# Patient Record
Sex: Female | Born: 1980 | Race: White | Hispanic: No | Marital: Married
Health system: Southern US, Community
[De-identification: ages and names within clinical notes are randomized; demographics above are authoritative.]

## PROBLEM LIST (undated history)

## (undated) DIAGNOSIS — J36 Peritonsillar abscess: Secondary | ICD-10-CM

## (undated) HISTORY — DX: Peritonsillar abscess: J36

---

## 2020-08-08 ENCOUNTER — Emergency Department: Payer: Self-pay

## 2020-08-08 ENCOUNTER — Encounter: Payer: Self-pay | Admitting: Emergency Medicine

## 2020-08-08 ENCOUNTER — Emergency Department
Admission: EM | Admit: 2020-08-08 | Discharge: 2020-08-08 | Disposition: A | Discharge status: Home / Self Care | Payer: Self-pay | Attending: Emergency Medicine | Admitting: Emergency Medicine

## 2020-08-08 ENCOUNTER — Other Ambulatory Visit: Payer: Self-pay

## 2020-08-08 DIAGNOSIS — J36 Peritonsillar abscess: Secondary | ICD-10-CM | POA: Insufficient documentation

## 2020-08-08 DIAGNOSIS — Z20822 Contact with and (suspected) exposure to covid-19: Secondary | ICD-10-CM | POA: Insufficient documentation

## 2020-08-08 LAB — CBC WITH DIFFERENTIAL/PLATELET
Abs Immature Granulocytes: 0.08 10*3/uL — ABNORMAL HIGH (ref 0.00–0.07)
Basophils Absolute: 0.1 10*3/uL (ref 0.0–0.1)
Basophils Relative: 0 %
Eosinophils Absolute: 0.3 10*3/uL (ref 0.0–0.5)
Eosinophils Relative: 2 %
HCT: 40.9 % (ref 36.0–46.0)
Hemoglobin: 12.7 g/dL (ref 12.0–15.0)
Immature Granulocytes: 1 %
Lymphocytes Relative: 18 %
Lymphs Abs: 2.9 10*3/uL (ref 0.7–4.0)
MCH: 23.8 pg — ABNORMAL LOW (ref 26.0–34.0)
MCHC: 31.1 g/dL (ref 30.0–36.0)
MCV: 76.6 fL — ABNORMAL LOW (ref 80.0–100.0)
Monocytes Absolute: 1.2 10*3/uL — ABNORMAL HIGH (ref 0.1–1.0)
Monocytes Relative: 8 %
Neutro Abs: 11.9 10*3/uL — ABNORMAL HIGH (ref 1.7–7.7)
Neutrophils Relative %: 71 %
Platelets: 305 10*3/uL (ref 150–400)
RBC: 5.34 MIL/uL — ABNORMAL HIGH (ref 3.87–5.11)
RDW: 20.6 % — ABNORMAL HIGH (ref 11.5–15.5)
WBC: 16.5 10*3/uL — ABNORMAL HIGH (ref 4.0–10.5)
nRBC: 0 % (ref 0.0–0.2)

## 2020-08-08 LAB — COMPREHENSIVE METABOLIC PANEL
ALT: 14 U/L (ref 0–44)
AST: 32 U/L (ref 15–41)
Albumin: 3.7 g/dL (ref 3.5–5.0)
Alkaline Phosphatase: 47 U/L (ref 38–126)
Anion gap: 9 (ref 5–15)
BUN: 10 mg/dL (ref 6–20)
CO2: 23 mmol/L (ref 22–32)
Calcium: 9.3 mg/dL (ref 8.9–10.3)
Chloride: 104 mmol/L (ref 98–111)
Creatinine, Ser: 0.79 mg/dL (ref 0.44–1.00)
GFR, Estimated: >60 mL/min (ref >60)
Glucose, Bld: 115 mg/dL — ABNORMAL HIGH (ref 70–99)
Potassium: 4.9 mmol/L (ref 3.5–5.1)
Sodium: 136 mmol/L (ref 135–145)
Total Bilirubin: 1 mg/dL (ref 0.3–1.2)
Total Protein: 7.6 g/dL (ref 6.5–8.1)

## 2020-08-08 LAB — RESP PANEL BY RT-PCR (FLU A&B, COVID) ARPGX2
Influenza A by PCR: NEGATIVE
Influenza B by PCR: NEGATIVE
SARS Coronavirus 2 by RT PCR: NEGATIVE

## 2020-08-08 LAB — GROUP A STREP BY PCR: Group A Strep by PCR: NOT DETECTED

## 2020-08-08 LAB — HCG, QUANTITATIVE, PREGNANCY: hCG, Beta Chain, Quant, S: 1 m[IU]/mL (ref <5)

## 2020-08-08 IMAGING — CT CT NECK W/ CM
3 of 4 series · 13 of 35 positions shown, 16 images · IV contrast (omnipaque)
Comparison: None.

CLINICAL DATA: Initial evaluation for acute swelling of left
tonsil.

EXAM:
CT NECK WITH CONTRAST
TECHNIQUE: Multidetector CT imaging of the neck was performed using the
standard protocol following the bolus administration of intravenous
contrast.
CONTRAST:  75mL OMNIPAQUE IOHEXOL 300 MG/ML  SOLN

[Series 5: sag neck · sagittal · 0.47mm/px · 5 of 111 slices shown, 6 images]
[im 37/111  bone]
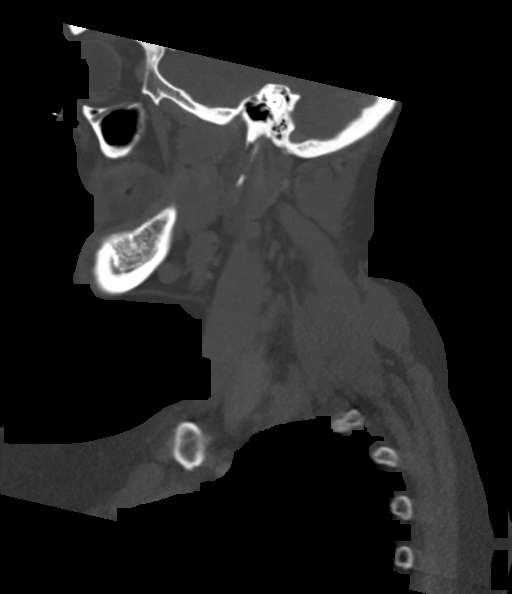
[im 46/111  bone]
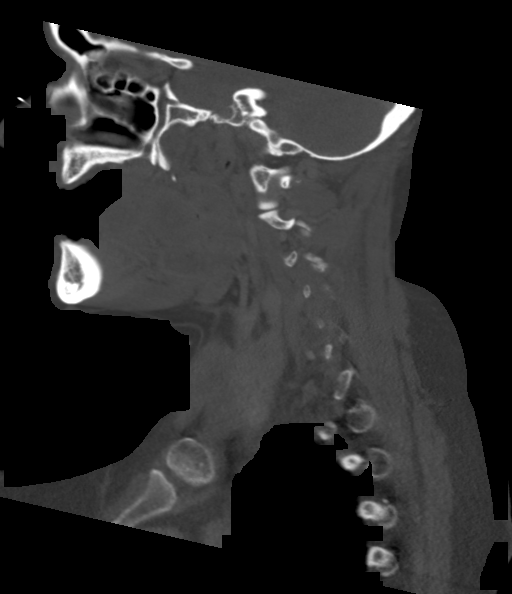
[im 56/111  soft-tissue]
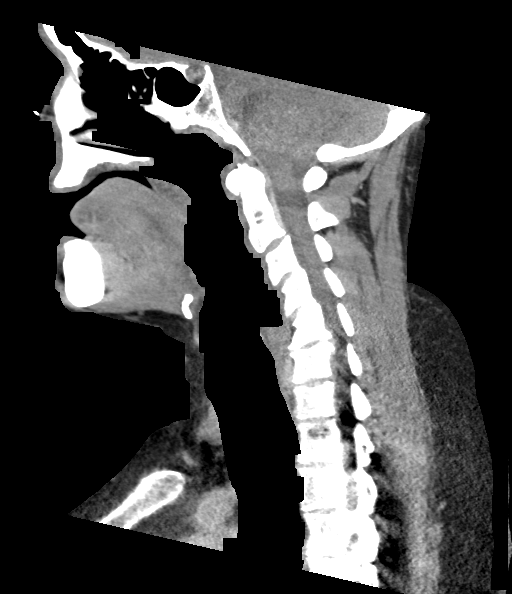
[im 56/111  bone]
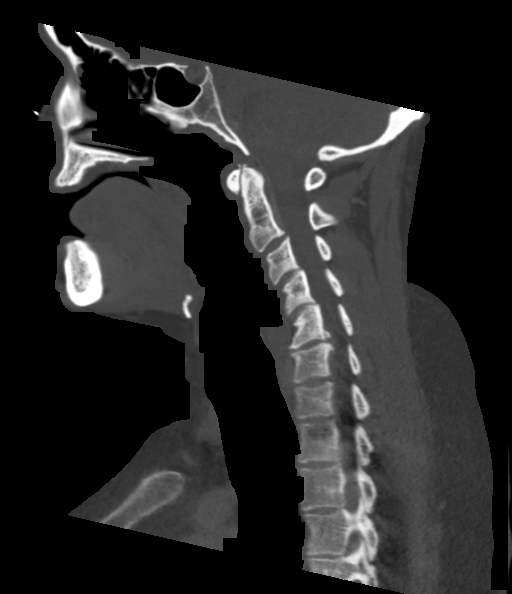
[im 65/111  bone]
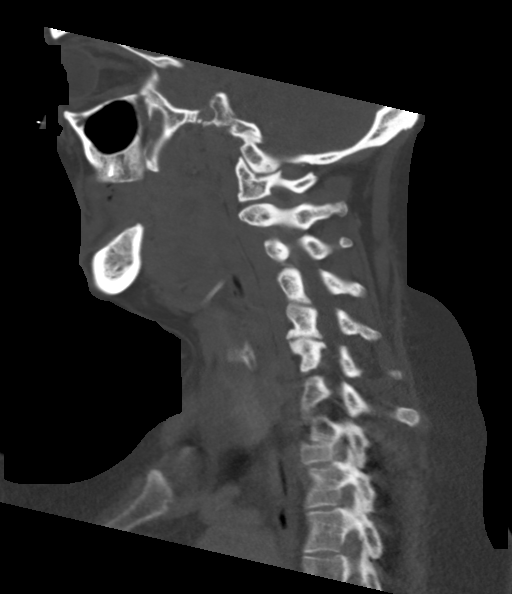
[im 74/111  bone]
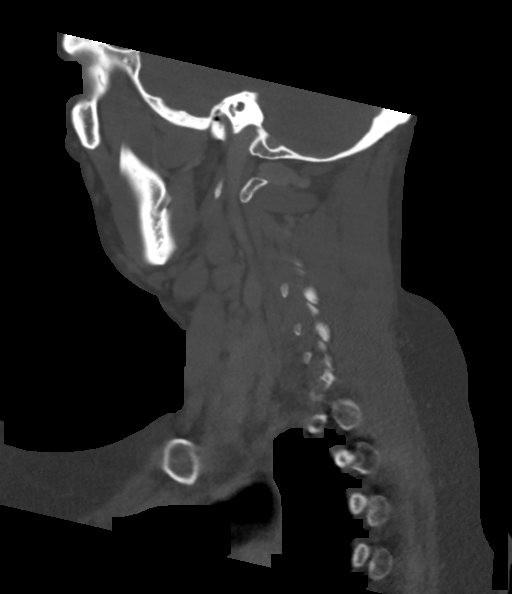

[Series 6: cor neck · coronal · 0.45mm/px · 3 of 117 slices shown]
[im 35/117  bone]
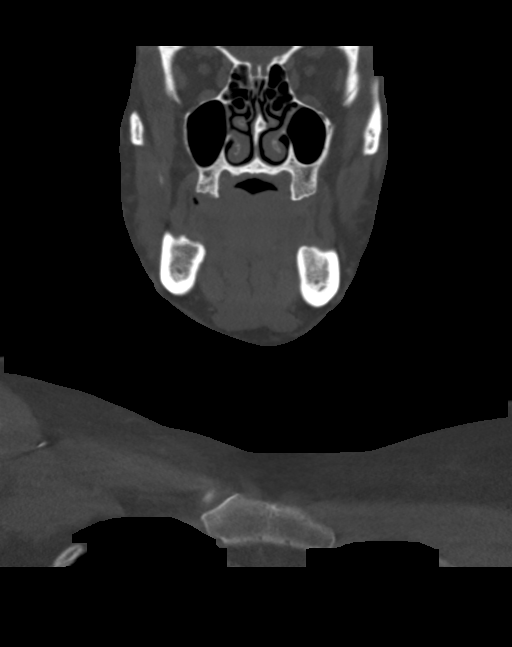
[im 51/117  bone]
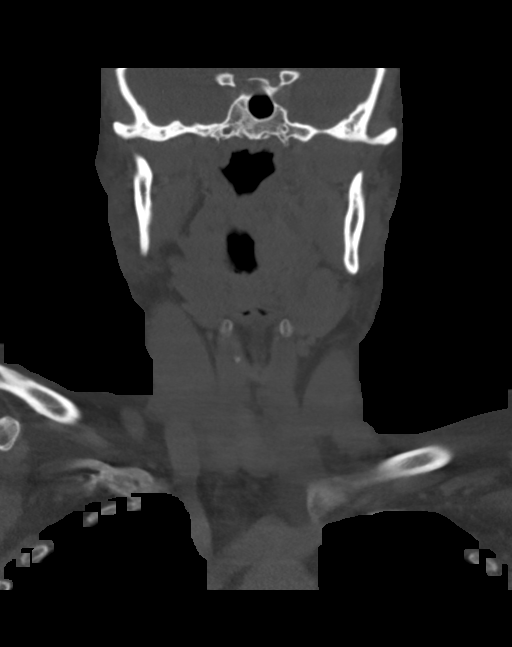
[im 67/117  bone]
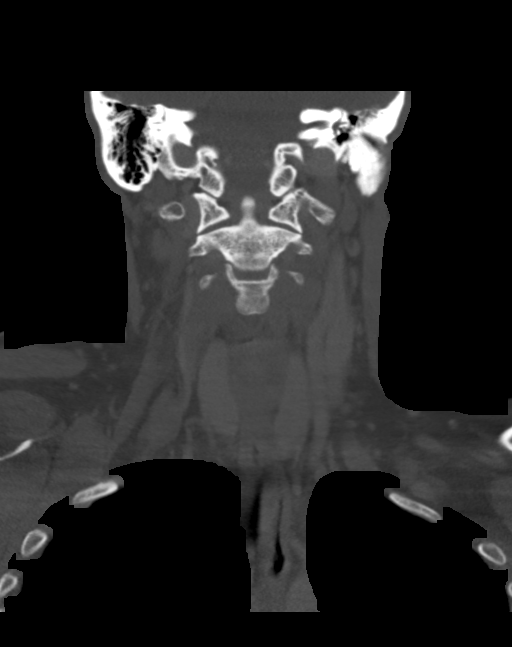

[Series 7: orthogonal ax · axial · 0.43mm/px · z∈[-270,-85]mm · 5 of 140 slices shown, 7 images]
[im 20/140  soft-tissue]
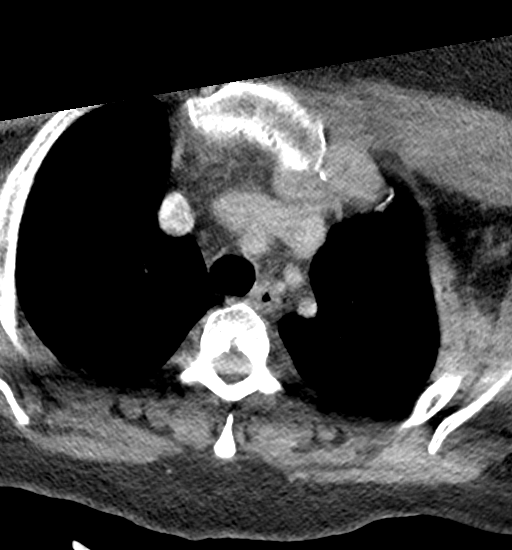
[im 20/140  bone]
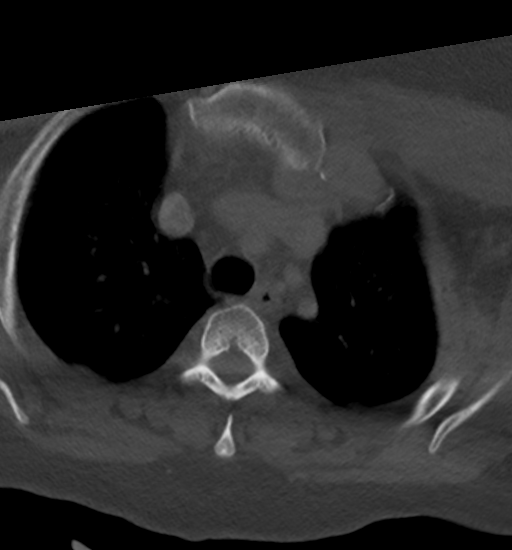
[im 40/140  bone]
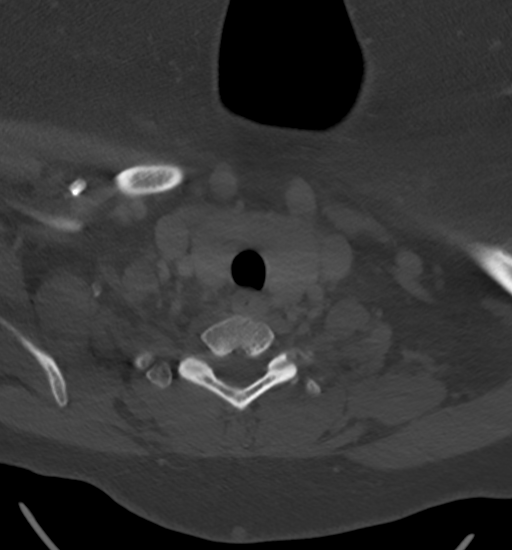
[im 80/140  bone]
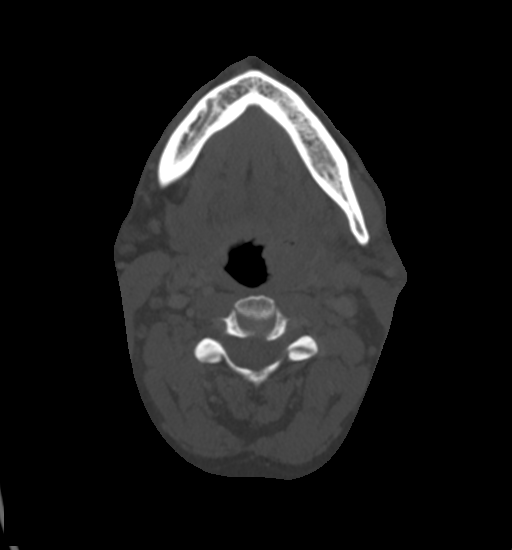
[im 100/140  bone]
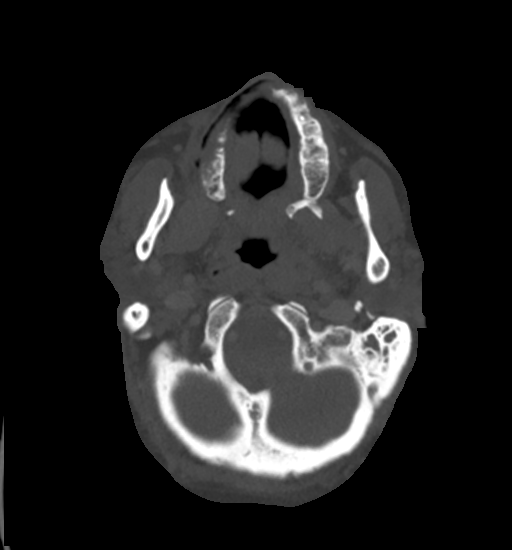
[im 120/140  soft-tissue]
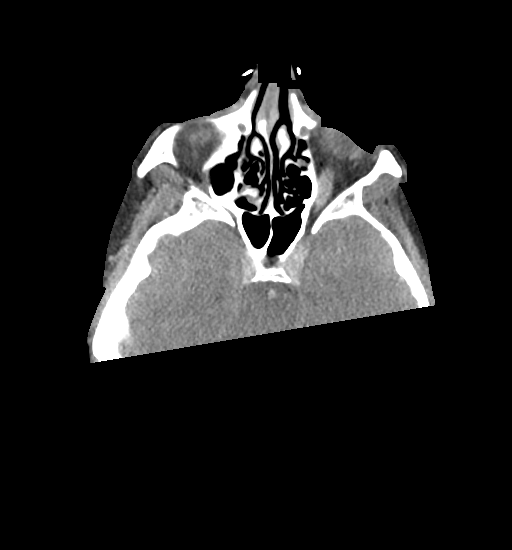
[im 120/140  bone]
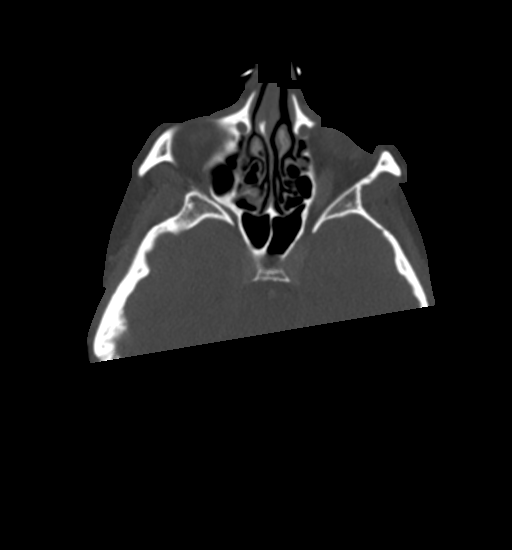

[13 of 35 positions shown; findings below may reference images not displayed]

FINDINGS: Pharynx and larynx: Oral cavity within normal limits. Asymmetric
enlargement and enhancement of the left tonsil, consistent with
acute tonsillitis. Curvilinear hypodense collection along the
lateral and anterior margin of the tonsil measures 8 x 17 x 9 mm,
consistent with tonsillar/peritonsillar abscess (series 6, image
51). Left tonsil is edematous and somewhat medialized towards the
midline. Associated inflammatory stranding within the adjacent left
parapharyngeal space. Right tonsil within normal limits. Nasopharynx
normal. No retropharyngeal collection or swelling. Negative
epiglottis. Vallecula clear. Hypopharynx and supraglottic larynx
within normal limits. Glottis normal. Subglottic airway patent
clear.

Salivary glands: Salivary glands including the parotid and
submandibular glands are within normal limits.

Thyroid: Normal.

Lymph nodes: Mildly prominent left level II lymph nodes measure up
to 1.1 cm, presumably reactive. No other enlarged or pathologic
adenopathy within the neck.

Vascular: Normal intravascular enhancement seen throughout the neck.

Limited intracranial: Unremarkable.

Visualized orbits: Unremarkable.

Mastoids and visualized paranasal sinuses: Mild mucosal thickening
within the ethmoidal air cells. Visualized paranasal sinuses are
clear. Moderate left mastoid effusion, which may in part be related
to the acute left-sided tonsillitis. Left middle ear cavity remains
largely clear.

Skeleton: No acute osseous finding. No discrete or worrisome osseous
lesions. Mild spondylosis at C5-6 without significant stenosis.
Patient is edentulous.

Upper chest: Visualized upper chest demonstrates no acute finding.
Partially visualized lungs are clear.

Other: 1.6 cm rounded well-circumscribed soft tissue lesion present
at the upper inner quadrant of the left breast (series 2, image
102), indeterminate.
IMPRESSION: 1. Findings consistent with acute left-sided tonsillitis with
associated 8 x 17 x 9 mm left tonsillar/peritonsillar abscess as
above.
2. Mildly prominent left level II lymph nodes, presumably reactive.
3. Moderate left mastoid effusion.
4. 1.6 cm rounded well-circumscribed soft tissue lesion at the upper
inner quadrant of the left breast, indeterminate. Correlation with
physical exam and mammography recommended.

## 2020-08-08 MED ORDER — KETOROLAC TROMETHAMINE 30 MG/ML IJ SOLN
15.0000 mg | Freq: Once | INTRAMUSCULAR | Status: AC
  Administered 2020-08-08: 15 mg via INTRAVENOUS
  Filled 2020-08-08: qty 1

## 2020-08-08 MED ORDER — AMOXICILLIN-POT CLAVULANATE 875-125 MG PO TABS: 1.0000 | ORAL_TABLET | Freq: Two times a day (BID) | ORAL | 0 refills | Status: AC

## 2020-08-08 MED ORDER — DEXAMETHASONE SODIUM PHOSPHATE 10 MG/ML IJ SOLN
10.0000 mg | Freq: Once | INTRAMUSCULAR | Status: AC
  Administered 2020-08-08: 10 mg via INTRAVENOUS
  Filled 2020-08-08: qty 1

## 2020-08-08 MED ORDER — LACTATED RINGERS IV BOLUS
1000.0000 mL | Freq: Once | INTRAVENOUS | Status: AC
  Administered 2020-08-08: 1000 mL via INTRAVENOUS

## 2020-08-08 MED ORDER — ACETAMINOPHEN 500 MG PO TABS
1000.0000 mg | ORAL_TABLET | Freq: Once | ORAL | Status: AC
  Administered 2020-08-08: 1000 mg via ORAL
  Filled 2020-08-08: qty 2

## 2020-08-08 MED ORDER — SODIUM CHLORIDE 0.9 % IV SOLN
3.0000 g | Freq: Once | INTRAVENOUS | Status: AC
  Administered 2020-08-08: 3 g via INTRAVENOUS
  Filled 2020-08-08: qty 8

## 2020-08-08 MED ORDER — IOHEXOL 300 MG/ML  SOLN
75.0000 mL | Freq: Once | INTRAMUSCULAR | Status: AC | PRN
  Administered 2020-08-08: 75 mL via INTRAVENOUS

## 2020-08-08 NOTE — ED Provider Notes (Addendum)
[facility] Emergency Department Provider Note  ____________________________________________   Event Date/Time   First MD Initiated Contact with Patient 08/08/20 1939     (approximate)  I have reviewed the triage vital signs and the nursing notes.   HISTORY  Chief Complaint Sore Throat   HPI Diane Blevins is a 40 y.o. 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  She states this is similar to prior peritonsillar abscess that she had had in the past.  She states she was told she stop her tonsils removed but has been able to do so secondary to some insurance issues.  She denies any fevers, earache, eye pain, or head pain, neck pain, cough, abdominal pain, nausea, vomiting, diarrhea, dysuria, rash or recent falls or injuries.  She denies illicit drug use, EtOH abuse or any other associated sick symptoms.  No other acute concerns at this time.         Past Medical History:  Diagnosis Date   Peritonsillar abscess     There are no problems to display for this patient.   History reviewed. No pertinent surgical history.  Prior to Admission medications   Medication Sig Start Date End Date Taking? Authorizing Provider  amoxicillin-clavulanate (AUGMENTIN) 875-125 MG tablet Take 1 tablet by mouth 2 (two) times daily for 14 days. 08/08/20 08/22/20 Yes Gilles Chiquito, MD    Allergies Patient has no known allergies.  History reviewed. No pertinent family history.  Social History    Review of Systems  Review of Systems  Constitutional:  Negative for chills and fever.  HENT:  Positive for sore throat.   Eyes:  Negative for pain.  Respiratory:  Positive for shortness of breath. Negative for cough and stridor.   Cardiovascular:  Negative  for chest pain.  Gastrointestinal:  Negative for vomiting.  Skin:  Negative for rash.  Neurological:  Negative for seizures, loss of consciousness and headaches.  Psychiatric/Behavioral:  Negative for suicidal ideas.   All other systems reviewed and are negative.    ____________________________________________   PHYSICAL EXAM:  VITAL SIGNS: ED Triage Vitals  Enc Vitals Group     BP 08/08/20 1924 122/80     Pulse Rate 08/08/20 1924 97     Resp 08/08/20 1924 20     Temp 08/08/20 1924 98.8 F (37.1 C)     Temp Source 08/08/20 1924 Oral     SpO2 08/08/20 1924 100 %     Weight 08/08/20 1922 260 lb (117.9 kg)     Height 08/08/20 1922 5\' 10"  (1.778 m)     Head Circumference --      Peak Flow --      Pain Score 08/08/20 1922 7     Pain Loc --      Pain Edu? --      Excl. in GC? --    Vitals:   08/08/20 1924 08/08/20 2037  BP: 122/80 111/76  Pulse: 97 91  Resp: 20 16  Temp: 98.8 F (37.1 C) 98.8 F (37.1 C)  SpO2: 100% 100%   Physical Exam Vitals and nursing note reviewed.  Constitutional:      General: She is not in acute distress.    Appearance: She is well-developed.  HENT:     Head: Normocephalic and atraumatic.  Right Ear: External ear normal.     Left Ear: External ear normal.     Nose: Nose normal.     Mouth/Throat:     Mouth: Mucous membranes are moist.  Eyes:     Conjunctiva/sclera: Conjunctivae normal.  Cardiovascular:     Rate and Rhythm: Normal rate and regular rhythm.     Heart sounds: No murmur heard. Pulmonary:     Effort: Pulmonary effort is normal. No respiratory distress.     Breath sounds: Normal breath sounds.  Abdominal:     Palpations: Abdomen is soft.     Tenderness: There is no abdominal tenderness.  Musculoskeletal:     Cervical back: Neck supple.  Skin:    General: Skin is warm and dry.     Capillary Refill: Capillary refill takes less than 2 seconds.  Neurological:     Mental Status: She is alert and oriented to person,  place, and time.  Psychiatric:        Mood and Affect: Mood normal.    Cranial nerves II to XII grossly intact.  Patient has some enlargement of her left sided tonsil with very slight displacement of the uvula to the right without any visible exudates.  Right tonsil is unremarkable.  Some posterior oropharyngeal erythema without any other oropharyngeal lesions.  She has forage motion of her neck.  No stridor over the neck.  There is no tachypnea lungs are clear bilaterally. ____________________________________________   LABS (all labs ordered are listed, but only abnormal results are displayed)  Labs Reviewed  CBC WITH DIFFERENTIAL/PLATELET - Abnormal; Notable for the following components:      Result Value   WBC 16.5 (*)    RBC 5.34 (*)    MCV 76.6 (*)    MCH 23.8 (*)    RDW 20.6 (*)    Neutro Abs 11.9 (*)    Monocytes Absolute 1.2 (*)    Abs Immature Granulocytes 0.08 (*)    All other components within normal limits  COMPREHENSIVE METABOLIC PANEL - Abnormal; Notable for the following components:   Glucose, Bld 115 (*)    All other components within normal limits  GROUP A STREP BY PCR  RESP PANEL BY RT-PCR (FLU A&B, COVID) ARPGX2  CULTURE, BLOOD (SINGLE)  HCG, QUANTITATIVE, PREGNANCY   ____________________________________________  EKG  ____________________________________________  RADIOLOGY  ED MD interpretation: CT neck consistent with acute left-sided tonsillitis and peritonsillar abscess.  There is also some lymphadenopathy.  Small mastoid effusion.  There is also a small collection time soft tissue mass to the left upper quadrant left breast.  Official radiology report(s): CT Soft Tissue Neck W Contrast  Result Date: 08/08/2020 CLINICAL DATA:  Initial evaluation for acute swelling of left tonsil. EXAM: CT NECK WITH CONTRAST TECHNIQUE: Multidetector CT imaging of the neck was performed using the standard protocol following the bolus administration of intravenous  contrast. CONTRAST:  85mL OMNIPAQUE IOHEXOL 300 MG/ML  SOLN COMPARISON:  None. FINDINGS: Pharynx and larynx: Oral cavity within normal limits. Asymmetric enlargement and enhancement of the left tonsil, consistent with acute tonsillitis. Curvilinear hypodense collection along the lateral and anterior margin of the tonsil measures 8 x 17 x 9 mm, consistent with tonsillar/peritonsillar abscess (series 6, image 51). Left tonsil is edematous and somewhat medialized towards the midline. Associated inflammatory stranding within the adjacent left parapharyngeal space. Right tonsil within normal limits. Nasopharynx normal. No retropharyngeal collection or swelling. Negative epiglottis. Vallecula clear. Hypopharynx and supraglottic larynx within normal limits. Glottis normal. Subglottic  airway patent clear. Salivary glands: Salivary glands including the parotid and submandibular glands are within normal limits. Thyroid: Normal. Lymph nodes: Mildly prominent left level II lymph nodes measure up to 1.1 cm, presumably reactive. No other enlarged or pathologic adenopathy within the neck. Vascular: Normal intravascular enhancement seen throughout the neck. Limited intracranial: Unremarkable. Visualized orbits: Unremarkable. Mastoids and visualized paranasal sinuses: Mild mucosal thickening within the ethmoidal air cells. Visualized paranasal sinuses are clear. Moderate left mastoid effusion, which may in part be related to the acute left-sided tonsillitis. Left middle ear cavity remains largely clear. Skeleton: No acute osseous finding. No discrete or worrisome osseous lesions. Mild spondylosis at C5-6 without significant stenosis. Patient is edentulous. Upper chest: Visualized upper chest demonstrates no acute finding. Partially visualized lungs are clear. Other: 1.6 cm rounded well-circumscribed soft tissue lesion present at the upper inner quadrant of the left breast (series 2, image 102), indeterminate. IMPRESSION: 1. Findings  consistent with acute left-sided tonsillitis with associated 8 x 17 x 9 mm left tonsillar/peritonsillar abscess as above. 2. Mildly prominent left level II lymph nodes, presumably reactive. 3. Moderate left mastoid effusion. 4. 1.6 cm rounded well-circumscribed soft tissue lesion at the upper inner quadrant of the left breast, indeterminate. Correlation with physical exam and mammography recommended. Electronically Signed   By: Rise MuBenjamin  McClintock M.D.   On: 08/08/2020 20:39    ____________________________________________   PROCEDURES  Procedure(s) performed (including Critical Care):  Procedures   ____________________________________________   INITIAL IMPRESSION / ASSESSMENT AND PLAN / ED COURSE      Patient presents with above-stated history exam for assessment of sore throat difficulty swallowing and some shortness of breath began yesterday.  On arrival she is afebrile and hemodynamically stable.  On exam she has some swelling of the left tonsil with some slight deviation of the uvula to the right.  No other evidence of other deep space infection in head or neck i.e. lower suspicion at this time for retropharyngeal abscess, Ludewig's angina mastoiditis or immediate airway impingement.  Patient has no stridor tachypnea and lungs are clear bilaterally with SPO2 of 100%.  CT neck consistent with acute left-sided tonsillitis and peritonsillar abscess.  There is also some lymphadenopathy.  Small mastoid effusion.  There is also a small collection time soft tissue mass to the left upper quadrant left breast.  No tenderness erythema or warmth over the mastoid process and will a small effusion on CT suspicion for acute mastoiditis at this time.  Suspect symptoms are related to the left-sided peritonsillar abscess.  BC with leukocytosis with WBC count of 16.5 and normal hemoglobin.  CMP unremarkable.  Strep and COVID PCR unremarkable.  hCG is negative.  I do not believe patient is septic at this  time.  Patient treated with Toradol, Tylenol, Unasyn and Decadron.  On reassessment she stated she had complete resolution of her shortness of breath and was able to tolerate some water by mouth.  She stated her pain actually vastly improved as well.  I offered drainage of abscess but after some discussion patient declined this preferring antibiotics only at this time with plan for patient ENT follow-up.  Advised this was critical and she should return immediately to emergency room if she experiences any worsening of her pain, sore throat, shortness of breath or any other acute symptoms.  Rx written for Augmentin.  Discharged stable condition.  Strict return precautions advised and discussed.  I also did advise patient of incidental finding of left breast mass and recommendation for  close outpatient PCP follow-up to coordinate follow-up imaging.  Patient voiced understanding and agreement with this plan.     ____________________________________________   FINAL CLINICAL IMPRESSION(S) / ED DIAGNOSES  Final diagnoses:  Peritonsillar abscess    Medications  acetaminophen (TYLENOL) tablet 1,000 mg (1,000 mg Oral Given 08/08/20 1948)  Ampicillin-Sulbactam (UNASYN) 3 g in sodium chloride 0.9 % 100 mL IVPB (0 g Intravenous Stopped 08/08/20 2030)  lactated ringers bolus 1,000 mL (0 mLs Intravenous Stopped 08/08/20 2059)  iohexol (OMNIPAQUE) 300 MG/ML solution 75 mL (75 mLs Intravenous Contrast Given 08/08/20 2009)  ketorolac (TORADOL) 30 MG/ML injection 15 mg (15 mg Intravenous Given 08/08/20 2042)  dexamethasone (DECADRON) injection 10 mg (10 mg Intravenous Given 08/08/20 2042)     ED Discharge Orders          Ordered    amoxicillin-clavulanate (AUGMENTIN) 875-125 MG tablet  2 times daily        08/08/20 2059             Note:  This document was prepared using Dragon voice recognition software and may include unintentional dictation errors.    Gilles Chiquito, MD 08/08/20 2103    Gilles Chiquito, MD 08/08/20 2130

## 2020-08-08 NOTE — ED Triage Notes (Signed)
Pt to ED via POV, states hx of peritonsilar abscess 2-3 times in 4-5 years. Pt states today feels the same. Pt with large swelling noted to L side of her throat.

## 2020-08-08 NOTE — ED Provider Notes (Signed)
Emergency Medicine Provider Triage Evaluation Note  Diane Blevins , a 40 y.o. female  was evaluated in triage.  Pt complains of swelling to left tonsil x 2 days. Hx of multiple PTA's has been told she needs tonsils removed but hasn't been able to afford it due to insurance. Denies known fevers at home. Reports increased shortness of breath and difficulty swallowing.  Review of Systems  Positive: Sore throat, difficulty swallowing Negative: Chest pain, fevers, cough  Physical Exam  There were no vitals taken for this visit. Gen:   Awake, no distress  ENT:   Left unilateral tonsilar enlargement with midline shift of the uvula Resp:  Normal effort  MSK:   Moves extremities without difficulty  Other:    Medical Decision Making  Medically screening exam initiated at 7:22 PM.  Appropriate orders placed.  Diane Blevins was informed that the remainder of the evaluation will be completed by another provider, this initial triage assessment does not replace that evaluation, and the importance of remaining in the ED until their evaluation is complete.    Lucy Chris, PA 08/08/20 1924    Gilles Chiquito, MD 08/08/20 [phone]

## 2020-08-08 NOTE — ED Notes (Signed)
Verified correct patient and correct discharge papers given. Pt alert and oriented X 4, stable for discharge. RR even and unlabored, color WNL. Discussed discharge instructions and follow-up as directed. Discharge medications discussed, when prescribed. Pt had opportunity to ask questions, and RN available to provide patient and/or family education.   

## 2020-08-08 NOTE — ED Notes (Signed)
EDP at bedside  

## 2020-08-08 NOTE — ED Notes (Signed)
Pt to CT

## 2020-08-13 LAB — CULTURE, BLOOD (SINGLE): Culture: NO GROWTH
